# Patient Record
Sex: Male | Born: 1940 | Race: White | Hispanic: No | Marital: Married | Smoking: Former smoker
Health system: Southern US, Community
[De-identification: ages and names within clinical notes are randomized; demographics above are authoritative.]

## PROBLEM LIST (undated history)

## (undated) DIAGNOSIS — I5030 Unspecified diastolic (congestive) heart failure: Secondary | ICD-10-CM

## (undated) DIAGNOSIS — J449 Chronic obstructive pulmonary disease, unspecified: Secondary | ICD-10-CM

## (undated) DIAGNOSIS — D649 Anemia, unspecified: Secondary | ICD-10-CM

## (undated) DIAGNOSIS — I2510 Atherosclerotic heart disease of native coronary artery without angina pectoris: Secondary | ICD-10-CM

## (undated) DIAGNOSIS — F419 Anxiety disorder, unspecified: Secondary | ICD-10-CM

## (undated) DIAGNOSIS — I639 Cerebral infarction, unspecified: Secondary | ICD-10-CM

## (undated) DIAGNOSIS — I739 Peripheral vascular disease, unspecified: Secondary | ICD-10-CM

## (undated) DIAGNOSIS — E785 Hyperlipidemia, unspecified: Secondary | ICD-10-CM

## (undated) DIAGNOSIS — E039 Hypothyroidism, unspecified: Secondary | ICD-10-CM

---

## 2016-01-08 ENCOUNTER — Ambulatory Visit (HOSPITAL_COMMUNITY)
Admission: AD | Admit: 2016-01-08 | Discharge: 2016-01-08 | Disposition: A | Discharge status: Home / Self Care | Payer: Medicare Other | Source: Other Acute Inpatient Hospital | Attending: Internal Medicine | Admitting: Internal Medicine

## 2016-01-08 ENCOUNTER — Inpatient Hospital Stay
Admission: EM | Admit: 2016-01-08 | Discharge: 2016-01-27 | Disposition: A | Discharge status: Skilled Nursing Facility | Payer: Medicare Other | Source: Other Acute Inpatient Hospital | Attending: Internal Medicine | Admitting: Internal Medicine

## 2016-01-08 ENCOUNTER — Other Ambulatory Visit (HOSPITAL_COMMUNITY): Payer: Medicare Other

## 2016-01-08 DIAGNOSIS — J8489 Other specified interstitial pulmonary diseases: Secondary | ICD-10-CM

## 2016-01-08 DIAGNOSIS — J431 Panlobular emphysema: Secondary | ICD-10-CM

## 2016-01-08 DIAGNOSIS — J189 Pneumonia, unspecified organism: Secondary | ICD-10-CM

## 2016-01-08 DIAGNOSIS — J9690 Respiratory failure, unspecified, unspecified whether with hypoxia or hypercapnia: Secondary | ICD-10-CM | POA: Diagnosis present

## 2016-01-08 DIAGNOSIS — I2699 Other pulmonary embolism without acute cor pulmonale: Secondary | ICD-10-CM

## 2016-01-08 HISTORY — DX: Unspecified diastolic (congestive) heart failure: I50.30

## 2016-01-08 HISTORY — DX: Hypothyroidism, unspecified: E03.9

## 2016-01-08 HISTORY — DX: Peripheral vascular disease, unspecified: I73.9

## 2016-01-08 HISTORY — DX: Anemia, unspecified: D64.9

## 2016-01-08 HISTORY — DX: Chronic obstructive pulmonary disease, unspecified: J44.9

## 2016-01-08 HISTORY — DX: Atherosclerotic heart disease of native coronary artery without angina pectoris: I25.10

## 2016-01-08 HISTORY — DX: Anxiety disorder, unspecified: F41.9

## 2016-01-08 HISTORY — DX: Hyperlipidemia, unspecified: E78.5

## 2016-01-08 HISTORY — DX: Cerebral infarction, unspecified: I63.9

## 2016-01-08 LAB — HEPARIN LEVEL (UNFRACTIONATED)
HEPARIN UNFRACTIONATED: 0.65 [IU]/mL (ref 0.30–0.70)
Heparin Unfractionated: >2.2 IU/mL — ABNORMAL HIGH (ref 0.30–0.70)

## 2016-01-08 LAB — COMPREHENSIVE METABOLIC PANEL
ALK PHOS: 82 U/L (ref 38–126)
ALT: 43 U/L (ref 17–63)
AST: 25 U/L (ref 15–41)
Albumin: 2.3 g/dL — ABNORMAL LOW (ref 3.5–5.0)
Anion gap: 9 (ref 5–15)
BUN: 27 mg/dL — AB (ref 6–20)
CALCIUM: 7.8 mg/dL — AB (ref 8.9–10.3)
CHLORIDE: 93 mmol/L — AB (ref 101–111)
CO2: 34 mmol/L — ABNORMAL HIGH (ref 22–32)
CREATININE: 1.31 mg/dL — AB (ref 0.61–1.24)
GFR, EST AFRICAN AMERICAN: 59 mL/min — AB (ref >60)
GFR, EST NON AFRICAN AMERICAN: 51 mL/min — AB (ref >60)
Glucose, Bld: 188 mg/dL — ABNORMAL HIGH (ref 65–99)
Potassium: 3.7 mmol/L (ref 3.5–5.1)
Sodium: 136 mmol/L (ref 135–145)
Total Bilirubin: 0.7 mg/dL (ref 0.3–1.2)
Total Protein: 5.4 g/dL — ABNORMAL LOW (ref 6.5–8.1)

## 2016-01-08 LAB — CBC WITH DIFFERENTIAL/PLATELET
BASOS PCT: 1 %
Basophils Absolute: 0.2 10*3/uL — ABNORMAL HIGH (ref 0.0–0.1)
EOS ABS: 0 10*3/uL (ref 0.0–0.7)
EOS PCT: 0 %
HEMATOCRIT: 34.1 % — AB (ref 39.0–52.0)
Hemoglobin: 10.7 g/dL — ABNORMAL LOW (ref 13.0–17.0)
LYMPHS ABS: 1.1 10*3/uL (ref 0.7–4.0)
Lymphocytes Relative: 5 %
MCH: 27.1 pg (ref 26.0–34.0)
MCHC: 31.4 g/dL (ref 30.0–36.0)
MCV: 86.3 fL (ref 78.0–100.0)
MONO ABS: 1.1 10*3/uL — AB (ref 0.1–1.0)
Monocytes Relative: 5 %
Neutro Abs: 20.3 10*3/uL — ABNORMAL HIGH (ref 1.7–7.7)
Neutrophils Relative %: 89 %
PLATELETS: 241 10*3/uL (ref 150–400)
RBC: 3.95 MIL/uL — AB (ref 4.22–5.81)
RDW: 15.9 % — AB (ref 11.5–15.5)
WBC: 22.7 10*3/uL — AB (ref 4.0–10.5)

## 2016-01-09 LAB — HEPARIN LEVEL (UNFRACTIONATED)
HEPARIN UNFRACTIONATED: 0.27 [IU]/mL — AB (ref 0.30–0.70)
HEPARIN UNFRACTIONATED: 0.27 [IU]/mL — AB (ref 0.30–0.70)
Heparin Unfractionated: 0.34 IU/mL (ref 0.30–0.70)
Heparin Unfractionated: 0.24 IU/mL — ABNORMAL LOW (ref 0.30–0.70)

## 2016-01-10 LAB — PROTIME-INR
INR: 1.12
INR: 1.09
PROTHROMBIN TIME: 14.5 s (ref 11.4–15.2)
Prothrombin Time: 14.1 seconds (ref 11.4–15.2)

## 2016-01-10 LAB — CBC WITH DIFFERENTIAL/PLATELET
BASOS ABS: 0 10*3/uL (ref 0.0–0.1)
Basophils Relative: 0 %
EOS ABS: 0 10*3/uL (ref 0.0–0.7)
Eosinophils Relative: 0 %
HCT: 34.5 % — ABNORMAL LOW (ref 39.0–52.0)
HEMOGLOBIN: 11.1 g/dL — AB (ref 13.0–17.0)
LYMPHS PCT: 3 %
Lymphs Abs: 0.7 10*3/uL (ref 0.7–4.0)
MCH: 27.9 pg (ref 26.0–34.0)
MCHC: 32.2 g/dL (ref 30.0–36.0)
MCV: 86.7 fL (ref 78.0–100.0)
MONOS PCT: 3 %
Monocytes Absolute: 0.7 10*3/uL (ref 0.1–1.0)
NEUTROS PCT: 94 %
Neutro Abs: 20.8 10*3/uL — ABNORMAL HIGH (ref 1.7–7.7)
PLATELETS: 265 10*3/uL (ref 150–400)
RBC: 3.98 MIL/uL — AB (ref 4.22–5.81)
RDW: 16.4 % — ABNORMAL HIGH (ref 11.5–15.5)
WBC: 22.2 10*3/uL — AB (ref 4.0–10.5)

## 2016-01-10 LAB — HEPARIN LEVEL (UNFRACTIONATED): HEPARIN UNFRACTIONATED: 0.28 [IU]/mL — AB (ref 0.30–0.70)

## 2016-01-11 LAB — PROTIME-INR
INR: 1.2
Prothrombin Time: 15.3 seconds — ABNORMAL HIGH (ref 11.4–15.2)

## 2016-01-12 LAB — CBC WITH DIFFERENTIAL/PLATELET
BASOS ABS: 0 10*3/uL (ref 0.0–0.1)
Basophils Relative: 0 %
EOS PCT: 0 %
Eosinophils Absolute: 0 10*3/uL (ref 0.0–0.7)
HEMATOCRIT: 35.7 % — AB (ref 39.0–52.0)
Hemoglobin: 11.2 g/dL — ABNORMAL LOW (ref 13.0–17.0)
LYMPHS ABS: 0.7 10*3/uL (ref 0.7–4.0)
Lymphocytes Relative: 3 %
MCH: 27.1 pg (ref 26.0–34.0)
MCHC: 31.4 g/dL (ref 30.0–36.0)
MCV: 86.4 fL (ref 78.0–100.0)
MONO ABS: 1 10*3/uL (ref 0.1–1.0)
Monocytes Relative: 4 %
Neutro Abs: 22.1 10*3/uL — ABNORMAL HIGH (ref 1.7–7.7)
Neutrophils Relative %: 93 %
Platelets: 274 10*3/uL (ref 150–400)
RBC: 4.13 MIL/uL — AB (ref 4.22–5.81)
RDW: 17 % — AB (ref 11.5–15.5)
WBC: 23.8 10*3/uL — AB (ref 4.0–10.5)

## 2016-01-12 LAB — PROTIME-INR
INR: 2.35
Prothrombin Time: 26.1 seconds — ABNORMAL HIGH (ref 11.4–15.2)

## 2016-01-13 ENCOUNTER — Encounter: Payer: Self-pay | Admitting: Adult Health

## 2016-01-13 DIAGNOSIS — J9611 Chronic respiratory failure with hypoxia: Secondary | ICD-10-CM | POA: Diagnosis not present

## 2016-01-13 DIAGNOSIS — I5032 Chronic diastolic (congestive) heart failure: Secondary | ICD-10-CM

## 2016-01-13 DIAGNOSIS — J432 Centrilobular emphysema: Secondary | ICD-10-CM

## 2016-01-13 LAB — PROTIME-INR
INR: 2.26
Prothrombin Time: 25.4 seconds — ABNORMAL HIGH (ref 11.4–15.2)

## 2016-01-13 LAB — BASIC METABOLIC PANEL
Anion gap: 6 (ref 5–15)
BUN: 25 mg/dL — AB (ref 6–20)
CHLORIDE: 100 mmol/L — AB (ref 101–111)
CO2: 30 mmol/L (ref 22–32)
Calcium: 8.4 mg/dL — ABNORMAL LOW (ref 8.9–10.3)
Creatinine, Ser: 0.94 mg/dL (ref 0.61–1.24)
GFR calc Af Amer: >60 mL/min (ref >60)
GFR calc non Af Amer: >60 mL/min (ref >60)
GLUCOSE: 164 mg/dL — AB (ref 65–99)
POTASSIUM: 5.3 mmol/L — AB (ref 3.5–5.1)
Sodium: 136 mmol/L (ref 135–145)

## 2016-01-13 LAB — CBC
HCT: 33.6 % — ABNORMAL LOW (ref 39.0–52.0)
HEMOGLOBIN: 10.6 g/dL — AB (ref 13.0–17.0)
MCH: 27.3 pg (ref 26.0–34.0)
MCHC: 31.5 g/dL (ref 30.0–36.0)
MCV: 86.6 fL (ref 78.0–100.0)
Platelets: 243 10*3/uL (ref 150–400)
RBC: 3.88 MIL/uL — AB (ref 4.22–5.81)
RDW: 17.2 % — ABNORMAL HIGH (ref 11.5–15.5)
WBC: 21.6 10*3/uL — ABNORMAL HIGH (ref 4.0–10.5)

## 2016-01-13 NOTE — Consult Note (Signed)
Name: Erik Rhodes MRN: [id number] DOB: April 23, 1941    ADMISSION DATE:  01/08/2016 CONSULTATION DATE:  9/20  REFERRING MD :  Sharyon Medicus   CHIEF COMPLAINT:  Hypoxic respiratory failure   BRIEF PATIENT DESCRIPTION: 75yo male with multiple medical problems including severe COPD on home O2 and chronic prednisone, chronic dCHF, anxiety, probable pulmonary HTN was initially admitted to outside hospital with acute on chronic respiratory failure r/t HCAP and PE.  He was tx 9/16 to [facility] at which time he was still requiring high flow nasal cannula.  PCCM consulted 9/21 to assist as he had been unable to wean from 50% vapotherm.    SIGNIFICANT EVENTS    STUDIES:  2D echo (osh) 9/11>> EF 60-65%, mild MR, grade 1 diastolic dysfunction, PASP  CTA chest (osh) 9/10>>>small L sided PE, mild edema, COPD    HISTORY OF PRESENT ILLNESS:  75yo male with multiple medical problems including severe COPD on home O2 and chronic prednisone, chronic dCHF, anxiety, probable pulmonary HTN was initially admitted to outside hospital with acute on chronic respiratory failure r/t HCAP and PE.  He was tx 9/16 to [facility] at which time he was still requiring high flow nasal cannula.  PCCM consulted 9/21 to assist as he had been unable to wean from 50% vapotherm.   Was originally DNR at outside facility, but this was reversed on arrival to [facility].   Currently denies SOB at rest, cough, chest pain, purulent sputum, leg/calf pain, edema.  Does c/o mild orthopnea.    PAST MEDICAL HISTORY :   has a past medical history of Anemia; Anxiety; CAD (coronary artery disease); COPD (chronic obstructive pulmonary disease) (HCC); CVA (cerebral infarction); Diastolic heart failure (HCC); Dyslipidemia; Hypothyroid; and PVD (peripheral vascular disease) (HCC).  has no past surgical history on file.  Allergies:  biaxin  Lasix  Lovastatin  Cefuroxime (nausea)  Doxy  levaquin (nausea)    FAMILY HISTORY:  family  history includes CAD in his mother; COPD in his father; Cancer in his sister. SOCIAL HISTORY:  reports that he quit smoking about 7 years ago. His smoking use included Cigarettes. He has a 100.00 pack-year smoking history. He has never used smokeless tobacco.  REVIEW OF SYSTEMS:   As per HPI - All other systems reviewed and were neg.   SUBJECTIVE:   VITAL SIGNS: Reviewed at bedside, abnormal values discussed in Imp/Plan    PHYSICAL EXAMINATION: General:  Chronically ill appearing male, NAD eating lunch  Neuro:  Awake, alert, appropriate, MAE  HEENT:  Mm moist, no JVD  Cardiovascular:  s1s2 rrr Lungs:  resps even non labored on high flow [place], diminished bases otherwise ess clear, rare exp wheeze  Abdomen:  Round, soft, non tender  Musculoskeletal:  Warm and dry, scant BLE edema   Recent Labs Lab 01/08/16 1601 01/13/16 0538  NA 136 136  K 3.7 5.3*  CL 93* 100*  CO2 34* 30  BUN 27* 25*  CREATININE 1.31* 0.94  GLUCOSE 188* 164*    Recent Labs Lab 01/10/16 1451 01/12/16 1554 01/13/16 0538  HGB 11.1* 11.2* 10.6*  HCT 34.5* 35.7* 33.6*  WBC 22.2* 23.8* 21.6*  PLT 265 274 243   No results found.  ASSESSMENT / PLAN:  Acute on chronic hypoxic/hypercarbic respiratory failure - multifactorial in setting severe COPD, recent HCAP, PE (small), diastolic dysfunction, some degree on pulmonary HTN and significant deconditioning.  Refusing bipap at times.  Requiring high flow nasal cannula (currently 20L/ 50%).  REC -  Reiterated need for qhs bipap  Diuresis as BP and SCr tolerate  S/p vanc/zosyn for HCAP - completed 9/19 Continue steroids - can likely transition to PO  Pulmonary hygiene  BD's -- budesonide, duonebs Ongoing rehab efforts  Coumadin per pharmacy for recent PE  F/u CXR   Discussed at length with pt and his wife at bedside.  He states that he would NOT want to be intubated as he understands that given the severity of his underlying lung disease it would be  very difficult for him to successfully liberate from vent.  He wishes to reinstate DNI.  Also discussed importance of compliance with medications, nebs and qhs bipap.     Erik DressKaty Tori Cupps, NP 01/13/2016  2:53 PM Pager: [phone] or [phone]

## 2016-01-14 ENCOUNTER — Other Ambulatory Visit (HOSPITAL_COMMUNITY): Payer: Medicare Other

## 2016-01-14 LAB — CBC WITH DIFFERENTIAL/PLATELET
BASOS ABS: 0 10*3/uL (ref 0.0–0.1)
BASOS PCT: 0 %
EOS ABS: 0 10*3/uL (ref 0.0–0.7)
Eosinophils Relative: 0 %
HEMATOCRIT: 36.9 % — AB (ref 39.0–52.0)
Hemoglobin: 11.8 g/dL — ABNORMAL LOW (ref 13.0–17.0)
Lymphocytes Relative: 5 %
Lymphs Abs: 1 10*3/uL (ref 0.7–4.0)
MCH: 28.2 pg (ref 26.0–34.0)
MCHC: 32 g/dL (ref 30.0–36.0)
MCV: 88.1 fL (ref 78.0–100.0)
MONO ABS: 0.9 10*3/uL (ref 0.1–1.0)
MONOS PCT: 4 %
NEUTROS ABS: 20.3 10*3/uL — AB (ref 1.7–7.7)
Neutrophils Relative %: 91 %
PLATELETS: 251 10*3/uL (ref 150–400)
RBC: 4.19 MIL/uL — ABNORMAL LOW (ref 4.22–5.81)
RDW: 17.6 % — AB (ref 11.5–15.5)
WBC: 22.3 10*3/uL — ABNORMAL HIGH (ref 4.0–10.5)

## 2016-01-14 LAB — PROTIME-INR
INR: 2.3
PROTHROMBIN TIME: 25.7 s — AB (ref 11.4–15.2)

## 2016-01-15 ENCOUNTER — Other Ambulatory Visit (HOSPITAL_COMMUNITY): Payer: Medicare Other

## 2016-01-15 LAB — PROTIME-INR
INR: 3.48
Prothrombin Time: 35.8 seconds — ABNORMAL HIGH (ref 11.4–15.2)

## 2016-01-16 ENCOUNTER — Other Ambulatory Visit (HOSPITAL_COMMUNITY): Payer: Medicare Other

## 2016-01-16 LAB — CBC WITH DIFFERENTIAL/PLATELET
Basophils Absolute: 0 10*3/uL (ref 0.0–0.1)
Basophils Relative: 0 %
EOS ABS: 0 10*3/uL (ref 0.0–0.7)
Eosinophils Relative: 0 %
HEMATOCRIT: 36.6 % — AB (ref 39.0–52.0)
HEMOGLOBIN: 11.5 g/dL — AB (ref 13.0–17.0)
LYMPHS ABS: 0.4 10*3/uL — AB (ref 0.7–4.0)
Lymphocytes Relative: 2 %
MCH: 27.6 pg (ref 26.0–34.0)
MCHC: 31.4 g/dL (ref 30.0–36.0)
MCV: 87.8 fL (ref 78.0–100.0)
MONO ABS: 0.9 10*3/uL (ref 0.1–1.0)
Monocytes Relative: 5 %
NEUTROS PCT: 93 %
Neutro Abs: 17.2 10*3/uL — ABNORMAL HIGH (ref 1.7–7.7)
Platelets: 226 10*3/uL (ref 150–400)
RBC: 4.17 MIL/uL — ABNORMAL LOW (ref 4.22–5.81)
RDW: 17.9 % — AB (ref 11.5–15.5)
WBC: 18.5 10*3/uL — ABNORMAL HIGH (ref 4.0–10.5)

## 2016-01-16 LAB — POTASSIUM: POTASSIUM: 4.6 mmol/L (ref 3.5–5.1)

## 2016-01-16 LAB — PROTIME-INR
INR: 3.92
PROTHROMBIN TIME: 39.4 s — AB (ref 11.4–15.2)

## 2016-01-17 DIAGNOSIS — J189 Pneumonia, unspecified organism: Secondary | ICD-10-CM | POA: Diagnosis not present

## 2016-01-17 DIAGNOSIS — J431 Panlobular emphysema: Secondary | ICD-10-CM | POA: Diagnosis not present

## 2016-01-17 DIAGNOSIS — J9601 Acute respiratory failure with hypoxia: Secondary | ICD-10-CM | POA: Diagnosis not present

## 2016-01-17 DIAGNOSIS — J9690 Respiratory failure, unspecified, unspecified whether with hypoxia or hypercapnia: Secondary | ICD-10-CM

## 2016-01-17 LAB — BASIC METABOLIC PANEL
Anion gap: 11 (ref 5–15)
BUN: 21 mg/dL — AB (ref 6–20)
CALCIUM: 8.7 mg/dL — AB (ref 8.9–10.3)
CHLORIDE: 100 mmol/L — AB (ref 101–111)
CO2: 28 mmol/L (ref 22–32)
CREATININE: 0.86 mg/dL (ref 0.61–1.24)
GFR calc Af Amer: >60 mL/min (ref >60)
GFR calc non Af Amer: >60 mL/min (ref >60)
GLUCOSE: 97 mg/dL (ref 65–99)
Potassium: 4.5 mmol/L (ref 3.5–5.1)
Sodium: 139 mmol/L (ref 135–145)

## 2016-01-17 LAB — CBC
HEMATOCRIT: 39.7 % (ref 39.0–52.0)
Hemoglobin: 12.3 g/dL — ABNORMAL LOW (ref 13.0–17.0)
MCH: 27.6 pg (ref 26.0–34.0)
MCHC: 31 g/dL (ref 30.0–36.0)
MCV: 89 fL (ref 78.0–100.0)
Platelets: 236 10*3/uL (ref 150–400)
RBC: 4.46 MIL/uL (ref 4.22–5.81)
RDW: 17.9 % — AB (ref 11.5–15.5)
WBC: 18 10*3/uL — ABNORMAL HIGH (ref 4.0–10.5)

## 2016-01-17 LAB — PROTIME-INR
INR: 2.5
Prothrombin Time: 27.5 seconds — ABNORMAL HIGH (ref 11.4–15.2)

## 2016-01-17 NOTE — Consult Note (Signed)
Name: Erik Rhodes MRN: [id number] DOB: Aug 19, 1940    ADMISSION DATE:  01/08/2016 CONSULTATION DATE:  9/20  REFERRING MD :  Sharyon Medicus   CHIEF COMPLAINT:  Hypoxic respiratory failure   BRIEF PATIENT DESCRIPTION: 75yo male with multiple medical problems including severe COPD on home O2 and chronic prednisone, chronic dCHF, anxiety, probable pulmonary HTN was initially admitted to outside hospital with acute on chronic respiratory failure r/t HCAP and PE.  He was tx 9/16 to [facility] at which time he was still requiring high flow nasal cannula.  PCCM consulted 9/21 to assist as he had been unable to wean from 50% vapotherm.    SIGNIFICANT EVENTS    STUDIES:  2D echo (osh) 9/11>> EF 60-65%, mild MR, grade 1 diastolic dysfunction, PASP  CTA chest (osh) 9/10>>>small L sided PE, mild edema, COPD     SUBJECTIVE:  Comfortable. O2 slowly coming down. On 5L Oximizer from HF and o2 sats 100% Has SOB (baseline) Tolerating bipap at HS (started 3 nights ago).   VITAL SIGNS: Reviewed at bedside. O2 sats 100% on 5L oximizer.    PHYSICAL EXAMINATION: General:  Chronically ill appearing male, NAD.  Neuro:  Awake, alert, appropriate, MAE  HEENT:  Mm moist, no JVD  Cardiovascular:  s1s2 rrr Lungs:  resps even non labored on high flow [place], diminished bases. Some crackles at bases. Abdomen:  Round, soft, non tender  Musculoskeletal:  Warm and dry, scant BLE edema   Recent Labs Lab 01/13/16 0538 01/16/16 1659 01/17/16 0910  NA 136  --  139  K 5.3* 4.6 4.5  CL 100*  --  100*  CO2 30  --  28  BUN 25*  --  21*  CREATININE 0.94  --  0.86  GLUCOSE 164*  --  97    Recent Labs Lab 01/14/16 1535 01/16/16 1607 01/17/16 0910  HGB 11.8* 11.5* 12.3*  HCT 36.9* 36.6* 39.7  WBC 22.3* 18.5* 18.0*  PLT 251 226 236   Ct Chest High Resolution  Result Date: 01/16/2016 CLINICAL DATA:  75 year old male inpatient with chronic respiratory failure with hypoxemia. Former smoker, quit in  2010. EXAM: CT CHEST WITHOUT CONTRAST TECHNIQUE: Multidetector CT imaging of the chest was performed following the standard protocol without intravenous contrast. High resolution imaging of the lungs, as well as inspiratory and expiratory imaging, was performed. COMPARISON:  01/08/2016 chest radiograph. FINDINGS: Cardiovascular: Normal heart size. No significant pericardial fluid/thickening. Left PICC terminates in the cavoatrial junction. Left anterior descending and right coronary atherosclerosis. Atherosclerotic nonaneurysmal thoracic aorta. Normal caliber pulmonary arteries. Mediastinum/Nodes: Coarse 4 mm calcification at the anterior right thyroid lobe. Unremarkable esophagus. No pathologically enlarged axillary, mediastinal or gross hilar lymph nodes, noting limited sensitivity for the detection of hilar adenopathy on this noncontrast study. Lungs/Pleura: No pneumothorax. Trace left pleural effusion. No right pleural effusion. Moderate centrilobular and paraseptal emphysema with mild diffuse bronchial wall thickening. There is patchy peribronchovascular and subpleural reticulation and ground-glass attenuation throughout both lungs with associated mild traction bronchiectasis, without a clear basilar gradient. There is superimposed patchy consolidation in the left greater than right lower lobes. No significant pulmonary nodules in the aerated portions of the lungs. No frank honeycombing. The expiration sequence is essentially nondiagnostic given no appreciable change in lung volumes between the inspiratory and expiratory sequences. Upper abdomen: Two hypodense subcentimeter liver lesions are too small to characterize and require no further follow-up. Musculoskeletal: No aggressive appearing focal osseous lesions. Mild thoracic spondylosis. IMPRESSION: 1. Fibrotic interstitial  lung disease characterized by patchy peribronchovascular and subpleural reticulation and ground-glass attenuation throughout both lungs  with associated mild traction bronchiectasis, without a clear basilar gradient. No frank honeycombing. 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 The significant peribronchovascular involvement, the absence of a basilar gradient and the absence of honeycombing are not typical of usual interstitial pneumonia (UIP), although this diagnosis is not entirely excluded. A follow-up high-resolution chest CT study is recommended in 6-12 months. 2. Patchy consolidation in the left greater than right lower lobes, suggesting superimposed infectious or aspiration pneumonia. 3. Trace left pleural effusion. 4. Moderate centrilobular and paraseptal emphysema with mild diffuse bronchial wall thickening, suggesting COPD. 5. Aortic atherosclerosis.  Two-vessel coronary atherosclerosis. Electronically Signed   By: Delbert PhenixJason A Poff M.D.   On: 01/16/2016 15:15    ASSESSMENT / PLAN:  Acute on chronic hypoxic/hypercarbic respiratory failure - multifactorial in setting severe COPD, recent HCAP, PE (small), diastolic dysfunction, some degree on pulmonary HTN and significant deconditioning.  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    REC -  Reiterated need for qhs bipap.  Currently he is using it.  He does not have bipap at home. When he gets discharged, he will need a sleep study to prove OSA and obtain cpap or bipap.  Wean off O2 to keep o2 sats > 88%.  May try [place] from oximizer in 1-2 days.  Cont pulmicort BID and Duoneb as ordered.  Continue steroids - switch to PO if it has not been done yet.  Pulmonary hygiene  Ongoing rehab efforts  Coumadin per pharmacy for recent PE  Pt is a no intubation and no CPR.   Plan d/w wife and pt.    Pollie MeyerJ. Angelo A de Dios, MD 01/17/2016, 11:35 AM [facility] Pager [phone] After 3 pm or if no answer,  call [phone]

## 2016-01-18 LAB — CBC WITH DIFFERENTIAL/PLATELET
Basophils Absolute: 0 10*3/uL (ref 0.0–0.1)
Basophils Relative: 0 %
EOS PCT: 0 %
Eosinophils Absolute: 0 10*3/uL (ref 0.0–0.7)
HCT: 35.7 % — ABNORMAL LOW (ref 39.0–52.0)
Hemoglobin: 11.2 g/dL — ABNORMAL LOW (ref 13.0–17.0)
LYMPHS ABS: 0.3 10*3/uL — AB (ref 0.7–4.0)
LYMPHS PCT: 1 %
MCH: 27.7 pg (ref 26.0–34.0)
MCHC: 31.4 g/dL (ref 30.0–36.0)
MCV: 88.1 fL (ref 78.0–100.0)
MONO ABS: 0.7 10*3/uL (ref 0.1–1.0)
MONOS PCT: 4 %
Neutro Abs: 18.2 10*3/uL — ABNORMAL HIGH (ref 1.7–7.7)
Neutrophils Relative %: 95 %
PLATELETS: 199 10*3/uL (ref 150–400)
RBC: 4.05 MIL/uL — AB (ref 4.22–5.81)
RDW: 18.3 % — AB (ref 11.5–15.5)
WBC: 19.3 10*3/uL — ABNORMAL HIGH (ref 4.0–10.5)

## 2016-01-18 LAB — PROTIME-INR
INR: 2.28
Prothrombin Time: 25.5 seconds — ABNORMAL HIGH (ref 11.4–15.2)

## 2016-01-19 DIAGNOSIS — J9620 Acute and chronic respiratory failure, unspecified whether with hypoxia or hypercapnia: Secondary | ICD-10-CM

## 2016-01-19 DIAGNOSIS — I2699 Other pulmonary embolism without acute cor pulmonale: Secondary | ICD-10-CM

## 2016-01-19 DIAGNOSIS — J8489 Other specified interstitial pulmonary diseases: Secondary | ICD-10-CM

## 2016-01-19 LAB — PROTIME-INR
INR: 2.12
PROTHROMBIN TIME: 24.1 s — AB (ref 11.4–15.2)

## 2016-01-19 NOTE — Progress Notes (Signed)
Name: Erik Rhodes MRN: [id number] DOB: 1941/02/01    ADMISSION DATE:  01/08/2016 CONSULTATION DATE:  9/20  REFERRING MD :  Sharyon Medicus   CHIEF COMPLAINT:  Hypoxic respiratory failure   BRIEF PATIENT DESCRIPTION: 75yo male with multiple medical problems including severe COPD on home O2 and chronic prednisone, chronic dCHF, anxiety, probable pulmonary HTN was initially admitted to outside hospital with acute on chronic respiratory failure r/t HCAP and PE.  He was tx 9/16 to [facility] at which time he was still requiring high flow nasal cannula.  PCCM consulted 9/21 to assist as he had been unable to wean from 50% vapotherm.    SIGNIFICANT EVENTS    STUDIES:  2D echo (osh) 9/11>> EF 60-65%, mild MR, grade 1 diastolic dysfunction, PASP  CTA chest (osh) 9/10>>>small L sided PE, mild edema, COPD   CT chest 9/24 > fibrotic ILD, findings suggestive of NSIP vs HSP.  Patchy consolidation LLL > RLL.  Moderate emphsema.   SUBJECTIVE:  Resting comfortably.  Continues to wean from O2, currently on 3L via oxymizer but SpO2 98% so RT to try [place] this AM.  Pt subjectively feels breathing is improved.  VITAL SIGNS:  81, 16, 98%.   PHYSICAL EXAMINATION: General:  Chronically ill appearing male, NAD.  Neuro:  Awake, alert, appropriate, MAE  HEENT:  Mm moist, no JVD  Cardiovascular:  s1s2 rrr Lungs:  resps even non labored on high flow [place], diminished bases. Minimal bibasilar crackles. Abdomen:  Round, soft, non tender  Musculoskeletal:  Warm and dry, scant BLE edema   Recent Labs Lab 01/13/16 0538 01/16/16 1659 01/17/16 0910  NA 136  --  139  K 5.3* 4.6 4.5  CL 100*  --  100*  CO2 30  --  28  BUN 25*  --  21*  CREATININE 0.94  --  0.86  GLUCOSE 164*  --  97    Recent Labs Lab 01/16/16 1607 01/17/16 0910 01/18/16 1515  HGB 11.5* 12.3* 11.2*  HCT 36.6* 39.7 35.7*  WBC 18.5* 18.0* 19.3*  PLT 226 236 199   No results found.  ASSESSMENT / PLAN:  Acute on chronic  hypoxic/hypercarbic respiratory failure - multifactorial in setting severe COPD, recent HCAP, PE (small), diastolic dysfunction, some degree on pulmonary HTN and significant deconditioning.  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   REC -  Wean off O2 to keep o2 sats > 88% - switch to [place] this AM Continue to use BiPAP QHS. He does not have bipap at home; therefore, when he gets discharged, he will need a sleep study to prove OSA and obtain cpap or BiPAP Cont pulmicort BID and Duoneb as ordered Continue steroids - switch to PO if it has not been done yet Pulmonary hygiene  Ongoing rehab efforts  Coumadin per pharmacy for recent PE  Pt is a no intubation and no CPR    Rutherford Guys, PA - [facility] Pager: [phone]  or [phone] 01/19/2016, 10:57 AM   Attending note: I have seen and examined the patient with nurse practitioner/resident and agree with the note. History, labs and imaging reviewed. Follow up for resp failure from COPD, HCAP, PE, non specific ILD. He is doing well on oximizer. Wean down to nasal cannula. Continue steroids.  Rest of plan as above.  Chilton Greathouse MD [facility] Pager [phone] If no answer or after 3pm call: [phone] 01/19/2016, 12:45 PM

## 2016-01-20 LAB — CBC WITH DIFFERENTIAL/PLATELET
BASOS ABS: 0 10*3/uL (ref 0.0–0.1)
BASOS PCT: 0 %
EOS ABS: 0 10*3/uL (ref 0.0–0.7)
EOS PCT: 0 %
HCT: 34.8 % — ABNORMAL LOW (ref 39.0–52.0)
HEMOGLOBIN: 11.3 g/dL — AB (ref 13.0–17.0)
LYMPHS ABS: 0.4 10*3/uL — AB (ref 0.7–4.0)
Lymphocytes Relative: 3 %
MCH: 28.3 pg (ref 26.0–34.0)
MCHC: 32.5 g/dL (ref 30.0–36.0)
MCV: 87 fL (ref 78.0–100.0)
Monocytes Absolute: 0.4 10*3/uL (ref 0.1–1.0)
Monocytes Relative: 3 %
Neutro Abs: 13.5 10*3/uL — ABNORMAL HIGH (ref 1.7–7.7)
Neutrophils Relative %: 94 %
PLATELETS: 207 10*3/uL (ref 150–400)
RBC: 4 MIL/uL — AB (ref 4.22–5.81)
RDW: 17.9 % — ABNORMAL HIGH (ref 11.5–15.5)
WBC: 14.3 10*3/uL — AB (ref 4.0–10.5)

## 2016-01-20 LAB — PROTIME-INR
INR: 2.47
Prothrombin Time: 27.2 seconds — ABNORMAL HIGH (ref 11.4–15.2)

## 2016-01-21 LAB — BASIC METABOLIC PANEL
ANION GAP: 4 — AB (ref 5–15)
BUN: 19 mg/dL (ref 6–20)
CALCIUM: 8.3 mg/dL — AB (ref 8.9–10.3)
CO2: 31 mmol/L (ref 22–32)
Chloride: 102 mmol/L (ref 101–111)
Creatinine, Ser: 0.86 mg/dL (ref 0.61–1.24)
GFR calc Af Amer: >60 mL/min (ref >60)
GFR calc non Af Amer: >60 mL/min (ref >60)
GLUCOSE: 55 mg/dL — AB (ref 65–99)
Potassium: 4.9 mmol/L (ref 3.5–5.1)
Sodium: 137 mmol/L (ref 135–145)

## 2016-01-21 LAB — CBC
HEMATOCRIT: 35.9 % — AB (ref 39.0–52.0)
HEMOGLOBIN: 11.3 g/dL — AB (ref 13.0–17.0)
MCH: 27.9 pg (ref 26.0–34.0)
MCHC: 31.5 g/dL (ref 30.0–36.0)
MCV: 88.6 fL (ref 78.0–100.0)
Platelets: 183 10*3/uL (ref 150–400)
RBC: 4.05 MIL/uL — AB (ref 4.22–5.81)
RDW: 18 % — AB (ref 11.5–15.5)
WBC: 12.8 10*3/uL — AB (ref 4.0–10.5)

## 2016-01-21 LAB — PROTIME-INR
INR: 2.68
PROTHROMBIN TIME: 29.1 s — AB (ref 11.4–15.2)

## 2016-01-22 LAB — CBC WITH DIFFERENTIAL/PLATELET
BASOS ABS: 0 10*3/uL (ref 0.0–0.1)
Basophils Relative: 0 %
EOS ABS: 0 10*3/uL (ref 0.0–0.7)
EOS PCT: 0 %
HCT: 35.2 % — ABNORMAL LOW (ref 39.0–52.0)
Hemoglobin: 10.9 g/dL — ABNORMAL LOW (ref 13.0–17.0)
LYMPHS PCT: 3 %
Lymphs Abs: 0.4 10*3/uL — ABNORMAL LOW (ref 0.7–4.0)
MCH: 27.7 pg (ref 26.0–34.0)
MCHC: 31 g/dL (ref 30.0–36.0)
MCV: 89.6 fL (ref 78.0–100.0)
MONO ABS: 0.3 10*3/uL (ref 0.1–1.0)
Monocytes Relative: 3 %
Neutro Abs: 11.4 10*3/uL — ABNORMAL HIGH (ref 1.7–7.7)
Neutrophils Relative %: 94 %
PLATELETS: 167 10*3/uL (ref 150–400)
RBC: 3.93 MIL/uL — ABNORMAL LOW (ref 4.22–5.81)
RDW: 18.1 % — AB (ref 11.5–15.5)
WBC: 12 10*3/uL — ABNORMAL HIGH (ref 4.0–10.5)

## 2016-01-22 LAB — PROTIME-INR
INR: 2.66
Prothrombin Time: 28.9 seconds — ABNORMAL HIGH (ref 11.4–15.2)

## 2016-01-23 LAB — CBC WITH DIFFERENTIAL/PLATELET
BASOS ABS: 0 10*3/uL (ref 0.0–0.1)
BASOS PCT: 0 %
EOS ABS: 0.2 10*3/uL (ref 0.0–0.7)
EOS PCT: 2 %
HCT: 33.5 % — ABNORMAL LOW (ref 39.0–52.0)
Hemoglobin: 10.4 g/dL — ABNORMAL LOW (ref 13.0–17.0)
Lymphocytes Relative: 11 %
Lymphs Abs: 1.3 10*3/uL (ref 0.7–4.0)
MCH: 27.7 pg (ref 26.0–34.0)
MCHC: 31 g/dL (ref 30.0–36.0)
MCV: 89.3 fL (ref 78.0–100.0)
Monocytes Absolute: 0.7 10*3/uL (ref 0.1–1.0)
Monocytes Relative: 6 %
Neutro Abs: 9.1 10*3/uL — ABNORMAL HIGH (ref 1.7–7.7)
Neutrophils Relative %: 81 %
PLATELETS: 157 10*3/uL (ref 150–400)
RBC: 3.75 MIL/uL — AB (ref 4.22–5.81)
RDW: 18.2 % — ABNORMAL HIGH (ref 11.5–15.5)
WBC: 11.3 10*3/uL — AB (ref 4.0–10.5)

## 2016-01-23 LAB — BLOOD GAS, ARTERIAL
ACID-BASE EXCESS: 1.8 mmol/L (ref 0.0–2.0)
Bicarbonate: 25.3 mmol/L (ref 20.0–28.0)
O2 CONTENT: 2 L/min
O2 SAT: 95.6 %
PCO2 ART: 35.7 mmHg (ref 32.0–48.0)
Patient temperature: 98.6
pH, Arterial: 7.464 — ABNORMAL HIGH (ref 7.350–7.450)
pO2, Arterial: 74.7 mmHg — ABNORMAL LOW (ref 83.0–108.0)

## 2016-01-23 LAB — BASIC METABOLIC PANEL
ANION GAP: 7 (ref 5–15)
BUN: 17 mg/dL (ref 6–20)
CO2: 31 mmol/L (ref 22–32)
Calcium: 8.2 mg/dL — ABNORMAL LOW (ref 8.9–10.3)
Chloride: 101 mmol/L (ref 101–111)
Creatinine, Ser: 0.82 mg/dL (ref 0.61–1.24)
GFR calc Af Amer: >60 mL/min (ref >60)
Glucose, Bld: 79 mg/dL (ref 65–99)
POTASSIUM: 4.1 mmol/L (ref 3.5–5.1)
SODIUM: 139 mmol/L (ref 135–145)

## 2016-01-23 LAB — PROTIME-INR
INR: 2.94
PROTHROMBIN TIME: 31.3 s — AB (ref 11.4–15.2)

## 2016-01-24 DIAGNOSIS — J9601 Acute respiratory failure with hypoxia: Secondary | ICD-10-CM | POA: Diagnosis not present

## 2016-01-24 DIAGNOSIS — J431 Panlobular emphysema: Secondary | ICD-10-CM | POA: Diagnosis not present

## 2016-01-24 LAB — CBC WITH DIFFERENTIAL/PLATELET
Basophils Absolute: 0 10*3/uL (ref 0.0–0.1)
Basophils Relative: 0 %
EOS PCT: 1 %
Eosinophils Absolute: 0.1 10*3/uL (ref 0.0–0.7)
HEMATOCRIT: 35.2 % — AB (ref 39.0–52.0)
Hemoglobin: 11.1 g/dL — ABNORMAL LOW (ref 13.0–17.0)
LYMPHS PCT: 4 %
Lymphs Abs: 0.4 10*3/uL — ABNORMAL LOW (ref 0.7–4.0)
MCH: 28.4 pg (ref 26.0–34.0)
MCHC: 31.5 g/dL (ref 30.0–36.0)
MCV: 90 fL (ref 78.0–100.0)
MONO ABS: 0.3 10*3/uL (ref 0.1–1.0)
MONOS PCT: 2 %
NEUTROS ABS: 10.3 10*3/uL — AB (ref 1.7–7.7)
Neutrophils Relative %: 93 %
Platelets: 148 10*3/uL — ABNORMAL LOW (ref 150–400)
RBC: 3.91 MIL/uL — ABNORMAL LOW (ref 4.22–5.81)
RDW: 18.4 % — AB (ref 11.5–15.5)
WBC: 11.1 10*3/uL — ABNORMAL HIGH (ref 4.0–10.5)

## 2016-01-24 LAB — PROTIME-INR
INR: 1.88
Prothrombin Time: 21.8 seconds — ABNORMAL HIGH (ref 11.4–15.2)

## 2016-01-24 NOTE — Progress Notes (Signed)
   Name: Claire ShownJackie A Godsey MRN: [id number] DOB: 01/03/1941    ADMISSION DATE:  01/08/2016 CONSULTATION DATE:  9/20  REFERRING MD :  Sharyon MedicusHijazi   CHIEF COMPLAINT:  Hypoxic respiratory failure   BRIEF PATIENT DESCRIPTION: 75yo male with multiple medical problems including severe COPD on home O2 and chronic prednisone, chronic dCHF, anxiety, probable pulmonary HTN was initially admitted to outside hospital with acute on chronic respiratory failure r/t HCAP and PE.  He was tx 9/16 to [facility] at which time he was still requiring high flow nasal cannula.  PCCM consulted 9/21 to assist as he had been unable to wean from 50% vapotherm.    SIGNIFICANT EVENTS    STUDIES:  2D echo (osh) 9/11>> EF 60-65%, mild MR, grade 1 diastolic dysfunction, PASP 40mmHg  CTA chest (osh) 9/10>>>small L sided PE, mild edema, COPD   CT chest 9/24 > fibrotic ILD, findings suggestive of NSIP vs HSP.  Patchy consolidation LLL > RLL.  Moderate emphsema.   SUBJECTIVE:  Resting comfortably.   On 2L [place].  Less SOB, comfortable.  Uses bipap at HS.   VITAL SIGNS:  81, 16, 98%.   PHYSICAL EXAMINATION: General:  Chronically ill appearing male, NAD.  Neuro:  Awake, alert, appropriate, MAE  HEENT:  Mm moist, no JVD  Cardiovascular:  s1s2 rrr Lungs:  resps even non labored on regular [place], diminished bases. Minimal bibasilar crackles. Abdomen:  Round, soft, non tender  Musculoskeletal:  Warm and dry, scant BLE edema   Recent Labs Lab 01/21/16 0559 01/23/16 0611  NA 137 139  K 4.9 4.1  CL 102 101  CO2 31 31  BUN 19 17  CREATININE 0.86 0.82  GLUCOSE 55* 79    Recent Labs Lab 01/21/16 0559 01/22/16 1710 01/23/16 0611  HGB 11.3* 10.9* 10.4*  HCT 35.9* 35.2* 33.5*  WBC 12.8* 12.0* 11.3*  PLT 183 167 157   No results found.  ASSESSMENT / PLAN:  Acute on chronic hypoxic/hypercarbic respiratory failure - multifactorial in setting severe COPD, recent HCAP, PE (small), diastolic dysfunction, some degree on  pulmonary HTN and significant deconditioning.  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   REC -  Currently on 2L  and O2 sats > 88%.  I think this is his baseline O2 requirment.  Continue to use BiPAP QHS. He does not have bipap at home; therefore, when he gets discharged from a facility, he will need a sleep study to prove OSA and obtain cpap or BiPAP Cont pulmicort BID and Duoneb as ordered Slow prednisone wean > wean off over 1-2 weeks if he is still on prednisone now.  Pulmonary hygiene  Ongoing rehab efforts  Coumadin per pharmacy for recent PE  Pt is a no intubation and no CPR  PCCM will sign off for now. Call back if with issues. Needs oupt follow up.   Pollie MeyerJ. Angelo A de Dios, MD 01/24/2016, 2:33 PM [facility] Pager [phone] After 3 pm or if no answer, call [phone]

## 2016-01-25 LAB — PROTIME-INR
INR: 1.42
PROTHROMBIN TIME: 17.5 s — AB (ref 11.4–15.2)

## 2016-01-26 LAB — CBC WITH DIFFERENTIAL/PLATELET
Basophils Absolute: 0 10*3/uL (ref 0.0–0.1)
Basophils Relative: 0 %
EOS ABS: 0 10*3/uL (ref 0.0–0.7)
Eosinophils Relative: 0 %
HEMATOCRIT: 37.1 % — AB (ref 39.0–52.0)
HEMOGLOBIN: 11.6 g/dL — AB (ref 13.0–17.0)
LYMPHS ABS: 0.5 10*3/uL — AB (ref 0.7–4.0)
LYMPHS PCT: 6 %
MCH: 28 pg (ref 26.0–34.0)
MCHC: 31.3 g/dL (ref 30.0–36.0)
MCV: 89.4 fL (ref 78.0–100.0)
MONOS PCT: 4 %
Monocytes Absolute: 0.3 10*3/uL (ref 0.1–1.0)
NEUTROS ABS: 8.1 10*3/uL — AB (ref 1.7–7.7)
NEUTROS PCT: 90 %
Platelets: 161 10*3/uL (ref 150–400)
RBC: 4.15 MIL/uL — AB (ref 4.22–5.81)
RDW: 18.2 % — ABNORMAL HIGH (ref 11.5–15.5)
WBC: 8.9 10*3/uL (ref 4.0–10.5)

## 2016-01-26 LAB — PROTIME-INR
INR: 1.52
PROTHROMBIN TIME: 18.5 s — AB (ref 11.4–15.2)

## 2016-01-27 LAB — PROTIME-INR
INR: 2.01
Prothrombin Time: 23.1 seconds — ABNORMAL HIGH (ref 11.4–15.2)

## 2018-05-06 IMAGING — CT CT CHEST HIGH RESOLUTION W/O CM
2 of 6 series · 11 of 36 positions shown, 13 images · non-contrast
Comparison: 01/08/2016 chest radiograph.

CLINICAL DATA: 75-year-old male inpatient with chronic respiratory
failure with hypoxemia. Former smoker, quit in 7424.

EXAM:
CT CHEST WITHOUT CONTRAST
TECHNIQUE: Multidetector CT imaging of the chest was performed following the
standard protocol without intravenous contrast. High resolution
imaging of the lungs, as well as inspiratory and expiratory imaging,
was performed.

[Series 201: hi res routine, idose (2) · axial · 0.78mm/px · z∈[+128,+383]mm · 8 of 128 slices shown, 10 images]
[im 13/128  mediastinal]
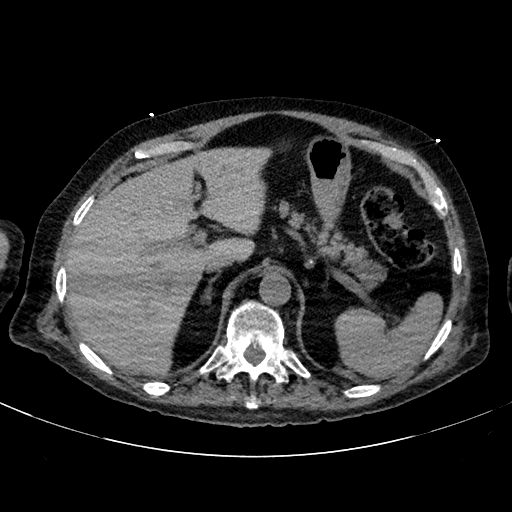
[im 13/128  lung]
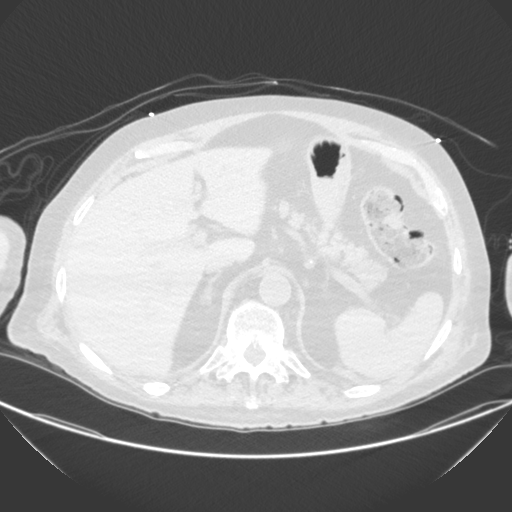
[im 26/128  lung]
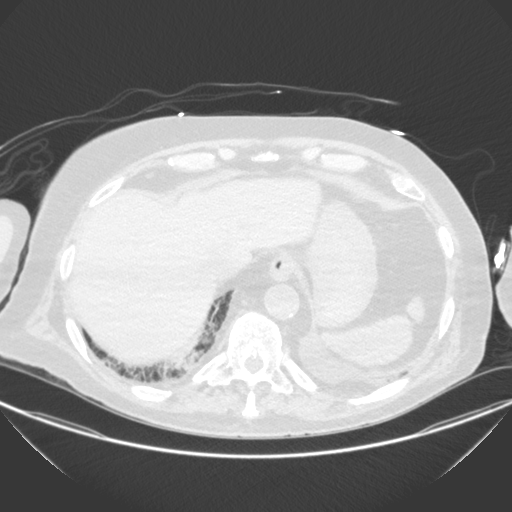
[im 39/128  lung]
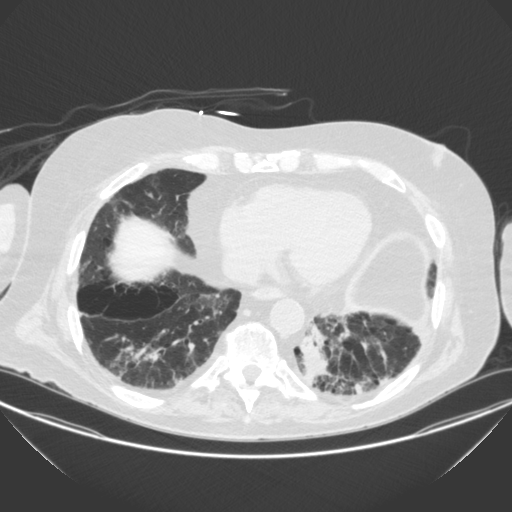
[im 58/128  lung]
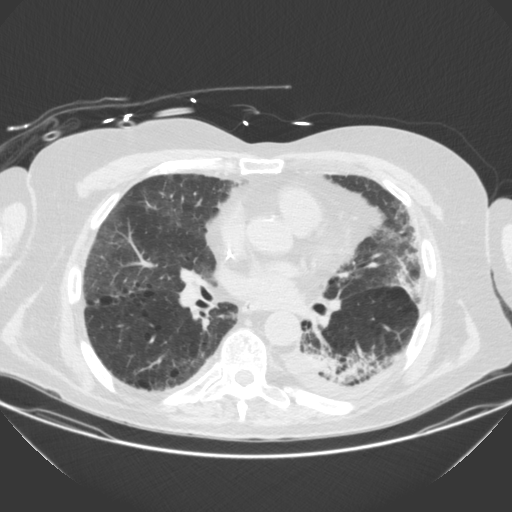
[im 70/128  mediastinal]
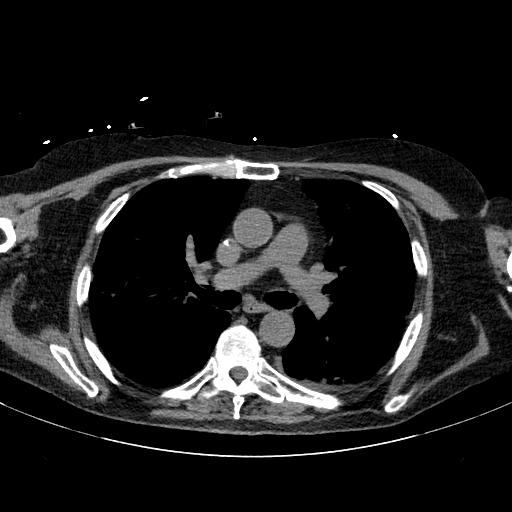
[im 70/128  lung]
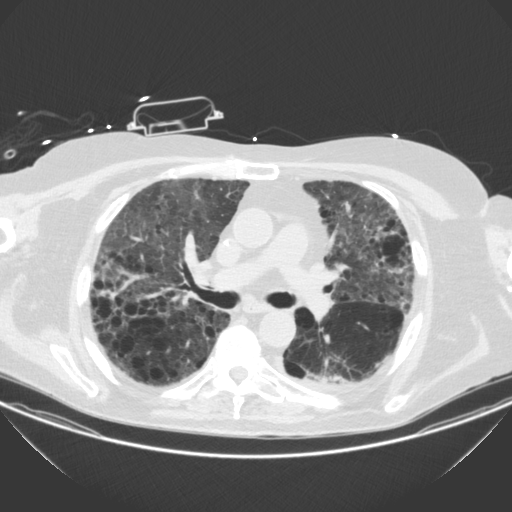
[im 89/128  lung]
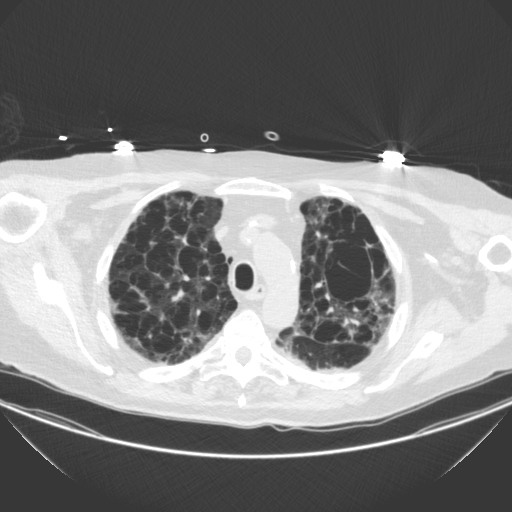
[im 102/128  lung]
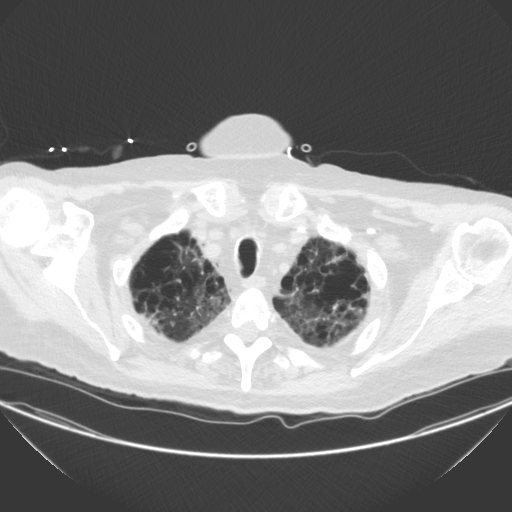
[im 115/128  lung]
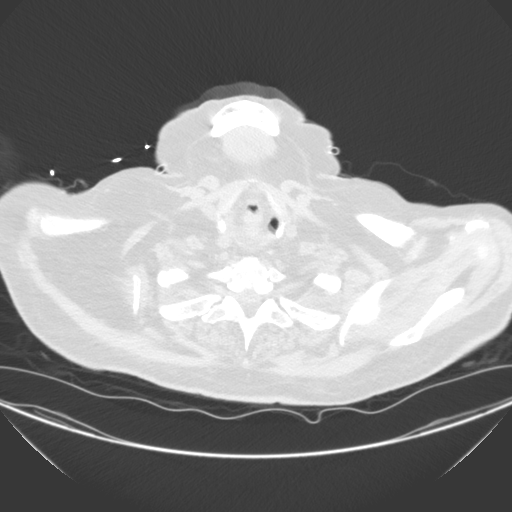

[Series 203: hi res coronal, idose (2) · coronal · 0.45mm/px · 3 of 138 slices shown]
[im 28/138  lung]
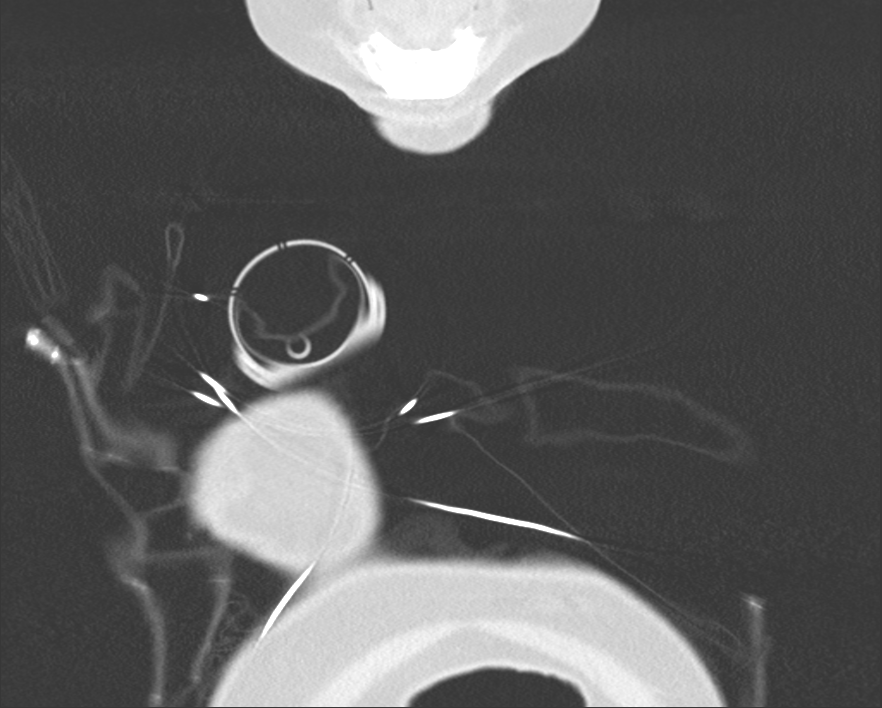
[im 55/138  lung]
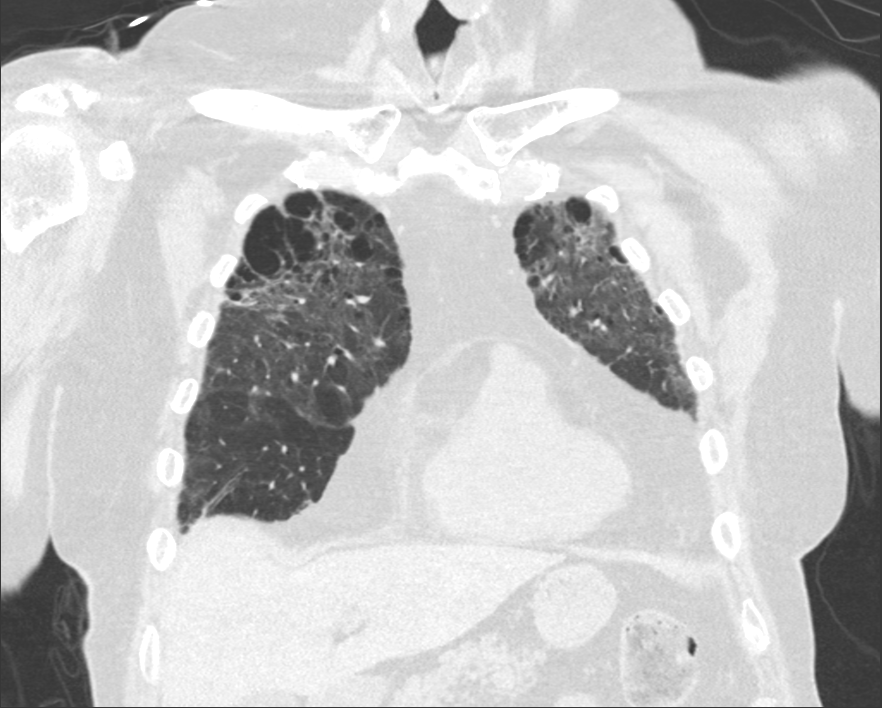
[im 83/138  lung]
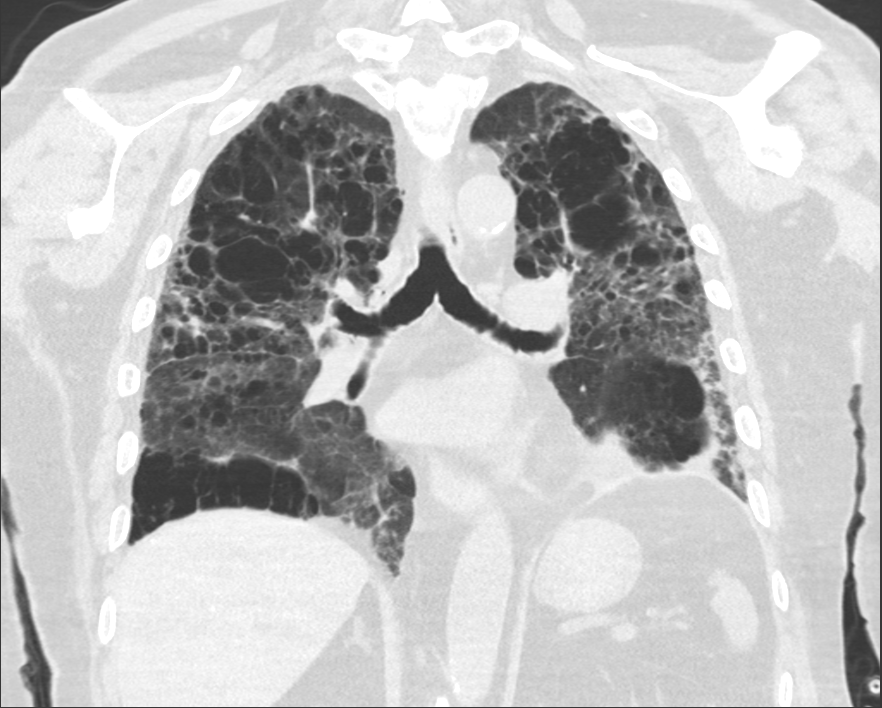

[11 of 36 positions shown; findings below may reference images not displayed]

FINDINGS: Cardiovascular: Normal heart size. No significant pericardial
fluid/thickening. Left PICC terminates in the cavoatrial junction.
Left anterior descending and right coronary atherosclerosis.
Atherosclerotic nonaneurysmal thoracic aorta. Normal caliber
pulmonary arteries.

Mediastinum/Nodes: Coarse 4 mm calcification at the anterior right
thyroid lobe. Unremarkable esophagus. No pathologically enlarged
axillary, mediastinal or gross hilar lymph nodes, noting limited
sensitivity for the detection of hilar adenopathy on this
noncontrast study.

Lungs/Pleura: No pneumothorax. Trace left pleural effusion. No right
pleural effusion. Moderate centrilobular and paraseptal emphysema
with mild diffuse bronchial wall thickening. There is patchy
peribronchovascular and subpleural reticulation and ground-glass
attenuation throughout both lungs with associated mild traction
bronchiectasis, without a clear basilar gradient. There is
superimposed patchy consolidation in the left greater than right
lower lobes. No significant pulmonary nodules in the aerated
portions of the lungs. No frank honeycombing. The expiration
sequence is essentially nondiagnostic given no appreciable change in
lung volumes between the inspiratory and expiratory sequences.

Upper abdomen: Two hypodense subcentimeter liver lesions are too
small to characterize and require no further follow-up.

Musculoskeletal: No aggressive appearing focal osseous lesions. Mild
thoracic spondylosis.
IMPRESSION: 1. Fibrotic interstitial lung disease characterized by patchy
peribronchovascular and subpleural reticulation and ground-glass
attenuation throughout both lungs with associated mild traction
bronchiectasis, without a clear basilar gradient. No frank
honeycombing. Findings suggest fibrotic nonspecific interstitial
pneumonia (NSIP) versus chronic hypersensitivity pneumonitis. The
significant peribronchovascular involvement, the absence of a
basilar gradient and the absence of honeycombing are not typical of
usual interstitial pneumonia (UIP), although this diagnosis is not
entirely excluded. A follow-up high-resolution chest CT study is
recommended in 6-12 months.
2. Patchy consolidation in the left greater than right lower lobes,
suggesting superimposed infectious or aspiration pneumonia.
3. Trace left pleural effusion.
4. Moderate centrilobular and paraseptal emphysema with mild diffuse
bronchial wall thickening, suggesting COPD.
5. Aortic atherosclerosis.  Two-vessel coronary atherosclerosis.

## 2021-11-22 DEATH — deceased
